# Patient Record
Sex: Female | Born: 1965 | Race: White | Hispanic: No | Marital: Married | Smoking: Never smoker
Health system: Southern US, Community
[De-identification: ages and names within clinical notes are randomized; demographics above are authoritative.]

---

## 2015-09-20 ENCOUNTER — Emergency Department (HOSPITAL_BASED_OUTPATIENT_CLINIC_OR_DEPARTMENT_OTHER)
Admission: EM | Admit: 2015-09-20 | Discharge: 2015-09-21 | Disposition: A | Discharge status: Home / Self Care | Payer: PRIVATE HEALTH INSURANCE | Attending: Emergency Medicine | Admitting: Emergency Medicine

## 2015-09-20 ENCOUNTER — Emergency Department (HOSPITAL_BASED_OUTPATIENT_CLINIC_OR_DEPARTMENT_OTHER): Payer: PRIVATE HEALTH INSURANCE

## 2015-09-20 ENCOUNTER — Encounter (HOSPITAL_BASED_OUTPATIENT_CLINIC_OR_DEPARTMENT_OTHER): Payer: Self-pay | Admitting: *Deleted

## 2015-09-20 DIAGNOSIS — Z3202 Encounter for pregnancy test, result negative: Secondary | ICD-10-CM | POA: Insufficient documentation

## 2015-09-20 DIAGNOSIS — Y9289 Other specified places as the place of occurrence of the external cause: Secondary | ICD-10-CM | POA: Insufficient documentation

## 2015-09-20 DIAGNOSIS — Y998 Other external cause status: Secondary | ICD-10-CM | POA: Insufficient documentation

## 2015-09-20 DIAGNOSIS — Y9389 Activity, other specified: Secondary | ICD-10-CM | POA: Insufficient documentation

## 2015-09-20 DIAGNOSIS — R200 Anesthesia of skin: Secondary | ICD-10-CM | POA: Insufficient documentation

## 2015-09-20 DIAGNOSIS — W19XXXA Unspecified fall, initial encounter: Secondary | ICD-10-CM

## 2015-09-20 DIAGNOSIS — W000XXA Fall on same level due to ice and snow, initial encounter: Secondary | ICD-10-CM | POA: Insufficient documentation

## 2015-09-20 DIAGNOSIS — S3992XA Unspecified injury of lower back, initial encounter: Secondary | ICD-10-CM | POA: Insufficient documentation

## 2015-09-20 DIAGNOSIS — M5442 Lumbago with sciatica, left side: Secondary | ICD-10-CM | POA: Insufficient documentation

## 2015-09-20 DIAGNOSIS — R52 Pain, unspecified: Secondary | ICD-10-CM

## 2015-09-20 LAB — PREGNANCY, URINE: PREG TEST UR: NEGATIVE

## 2015-09-20 MED ORDER — HYDROCODONE-ACETAMINOPHEN 5-325 MG PO TABS
1.0000 | ORAL_TABLET | Freq: Once | ORAL | Status: AC
  Administered 2015-09-20: 1 via ORAL
  Filled 2015-09-20: qty 1

## 2015-09-20 NOTE — ED Notes (Signed)
Bilateral lower back pain for over a week after falling on ice, no incontinence, no groin numbness, able to ambulate without significant difficulty.

## 2015-09-20 NOTE — ED Notes (Addendum)
She slipped in the snow a week ago. Injury to her lower back. Pain continues. Pain radiates down her left leg.

## 2015-09-20 NOTE — ED Provider Notes (Signed)
CSN: [id number]     Arrival date & time 09/20/15  1923 History   First MD Initiated Contact with Patient 09/20/15 2209     Chief Complaint  Patient presents with  . Back Pain     (Consider location/radiation/quality/duration/timing/severity/associated sxs/prior Treatment) The history is provided by the patient. A language interpreter was used (Interpretation provided by patient's family members).     Patient presents with low back pain that radiates into her left leg with tingling in the left leg to the level of the knee.  This began after slipping and falling on the ice last week.  Has been taking ibuprofen and aspirin with some improvement during the day.  Denies fever, abdominal pain, loss of control of bowel or bladder, leg weakness.    History reviewed. No pertinent past medical history. History reviewed. No pertinent past surgical history. No family history on file. Social History  Substance Use Topics  . Smoking status: Never Smoker   . Smokeless tobacco: None  . Alcohol Use: No   OB History    No data available     Review of Systems  Constitutional: Negative for fever.  Gastrointestinal: Negative for nausea, vomiting, abdominal pain, diarrhea and constipation.  Genitourinary: Negative for dysuria, urgency and frequency.  Musculoskeletal: Positive for back pain. Negative for gait problem and neck stiffness.  Skin: Negative for color change and wound.  Allergic/Immunologic: Negative for immunocompromised state.  Neurological: Positive for numbness. Negative for weakness.  Hematological: Does not bruise/bleed easily.  Psychiatric/Behavioral: Negative for self-injury (accidental ).      Allergies  Review of patient's allergies indicates no known allergies.  Home Medications   Prior to Admission medications   Not on File   BP 135/85 mmHg  Pulse 91  Temp(Src) 98.1 F (36.7 C) (Oral)  Resp 18  Ht 5\' 6"  (1.676 m)  Wt 91.003 kg  BMI 32.40 kg/m2  SpO2 100%   LMP 09/06/2015 Physical Exam  Constitutional: She appears well-developed and well-nourished. No distress.  HENT:  Head: Normocephalic and atraumatic.  Neck: Neck supple.  Pulmonary/Chest: Effort normal.  Abdominal: Soft. She exhibits no distension and no mass. There is no tenderness. There is no rebound and no guarding.  Musculoskeletal:       Back:  Spine no crepitus, or stepoffs.  Tenderness and left lumbar/sacral area only.   Lower extremities:  Strength 5/5, sensation intact, distal pulses intact.     Neurological: She is alert.  Skin: She is not diaphoretic.  Nursing note and vitals reviewed.   ED Course  Procedures (including critical care time) Labs Review Labs Reviewed - No data to display  Imaging Review No results found. I have personally reviewed and evaluated these images and lab results as part of my medical decision-making.   EKG Interpretation None      MDM   Final diagnoses:  Pain  Fall  Left-sided low back pain with left-sided sciatica    Afebrile, nontoxic patient with traumatic low back pain with left lower extremity radiculopathy. No red flags.  Xrays negative.  D/C home with norco, prednisone.  Pt going back to her home country [place] next week.   Discussed result, findings, treatment, and follow up  with patient.  Pt given return precautions.  Pt verbalizes understanding and agrees with plan.      [place] PetersburgEmily Danford Tat, PA-C 09/21/15 [phone]  Rolan BuccoMelanie Belfi, MD 09/21/15 1455

## 2015-09-21 MED ORDER — HYDROCODONE-ACETAMINOPHEN 5-325 MG PO TABS: 1.0000 | ORAL_TABLET | ORAL | Status: AC | PRN

## 2015-09-21 MED ORDER — PREDNISONE 10 MG (21) PO TBPK: 10.0000 mg | ORAL_TABLET | Freq: Every day | ORAL | Status: AC

## 2015-09-21 NOTE — Discharge Instructions (Signed)
Read the information below.  Use the prescribed medication as directed.  Please discuss all new medications with your pharmacist.  Do not take additional tylenol while taking the prescribed pain medication to avoid overdose.  You may return to the Emergency Department at any time for worsening condition or any new symptoms that concern you.    If you develop fevers, loss of control of bowel or bladder, weakness or numbness in your legs, or are unable to walk, return to the ER for a recheck.  °

## 2016-12-05 IMAGING — CR DG SACRUM/COCCYX 2+V
3 series · 3 of 3 positions shown · non-contrast
Comparison: None.

CLINICAL DATA: 49-year-old female with fall and back pain.

EXAM:
LUMBAR SPINE - COMPLETE 4+ VIEW; SACRUM AND COCCYX - 2+ VIEW

[t sacrum lat]
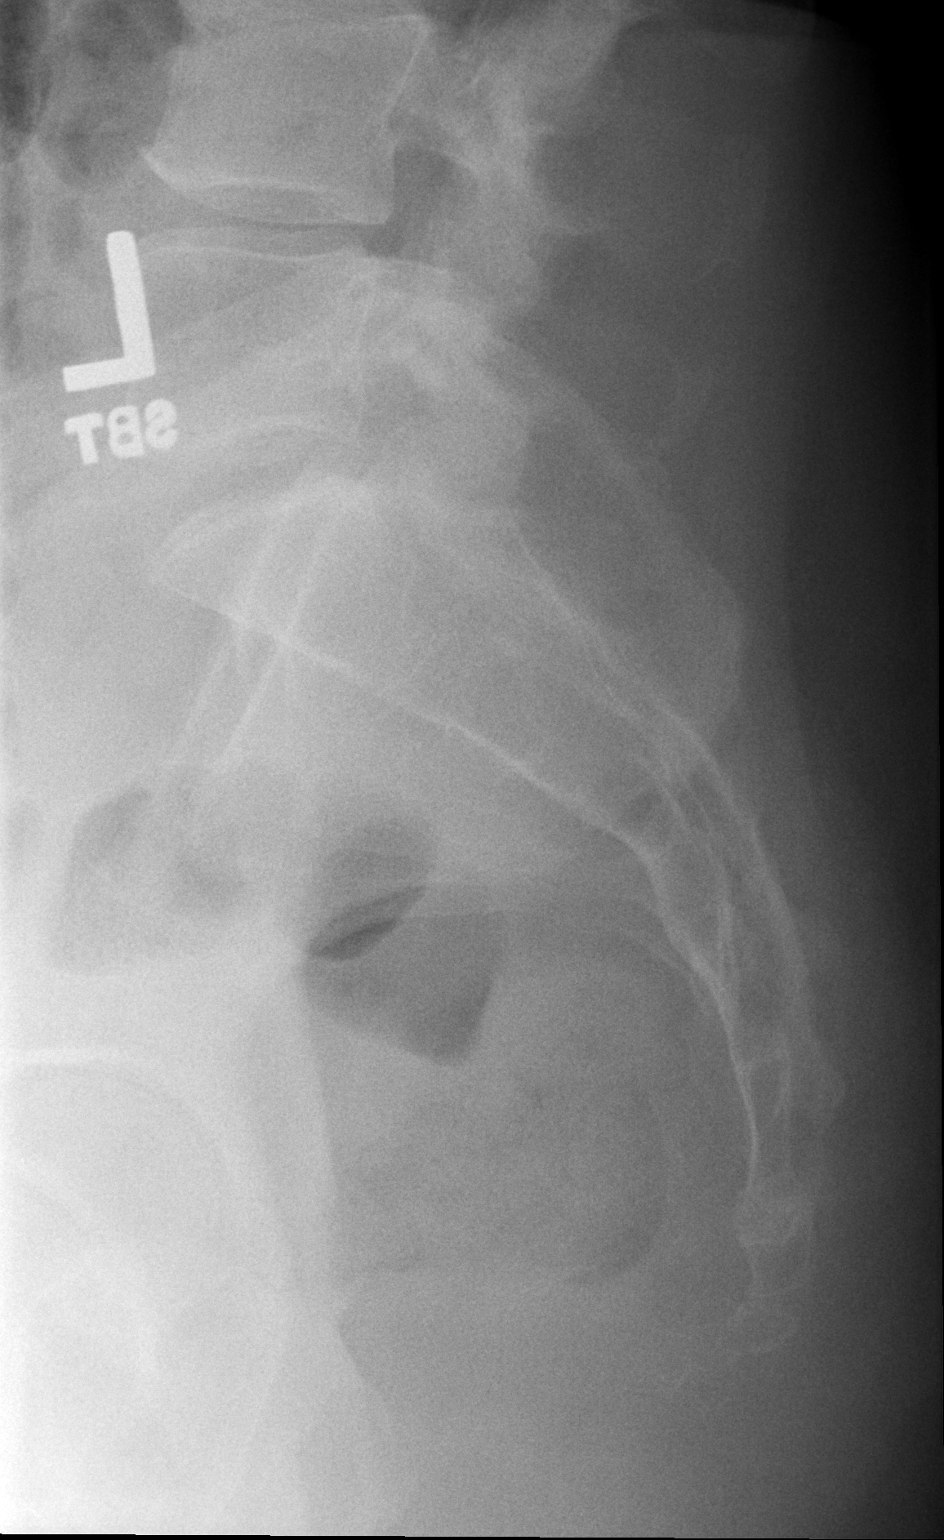

[t sacrum a.p.]
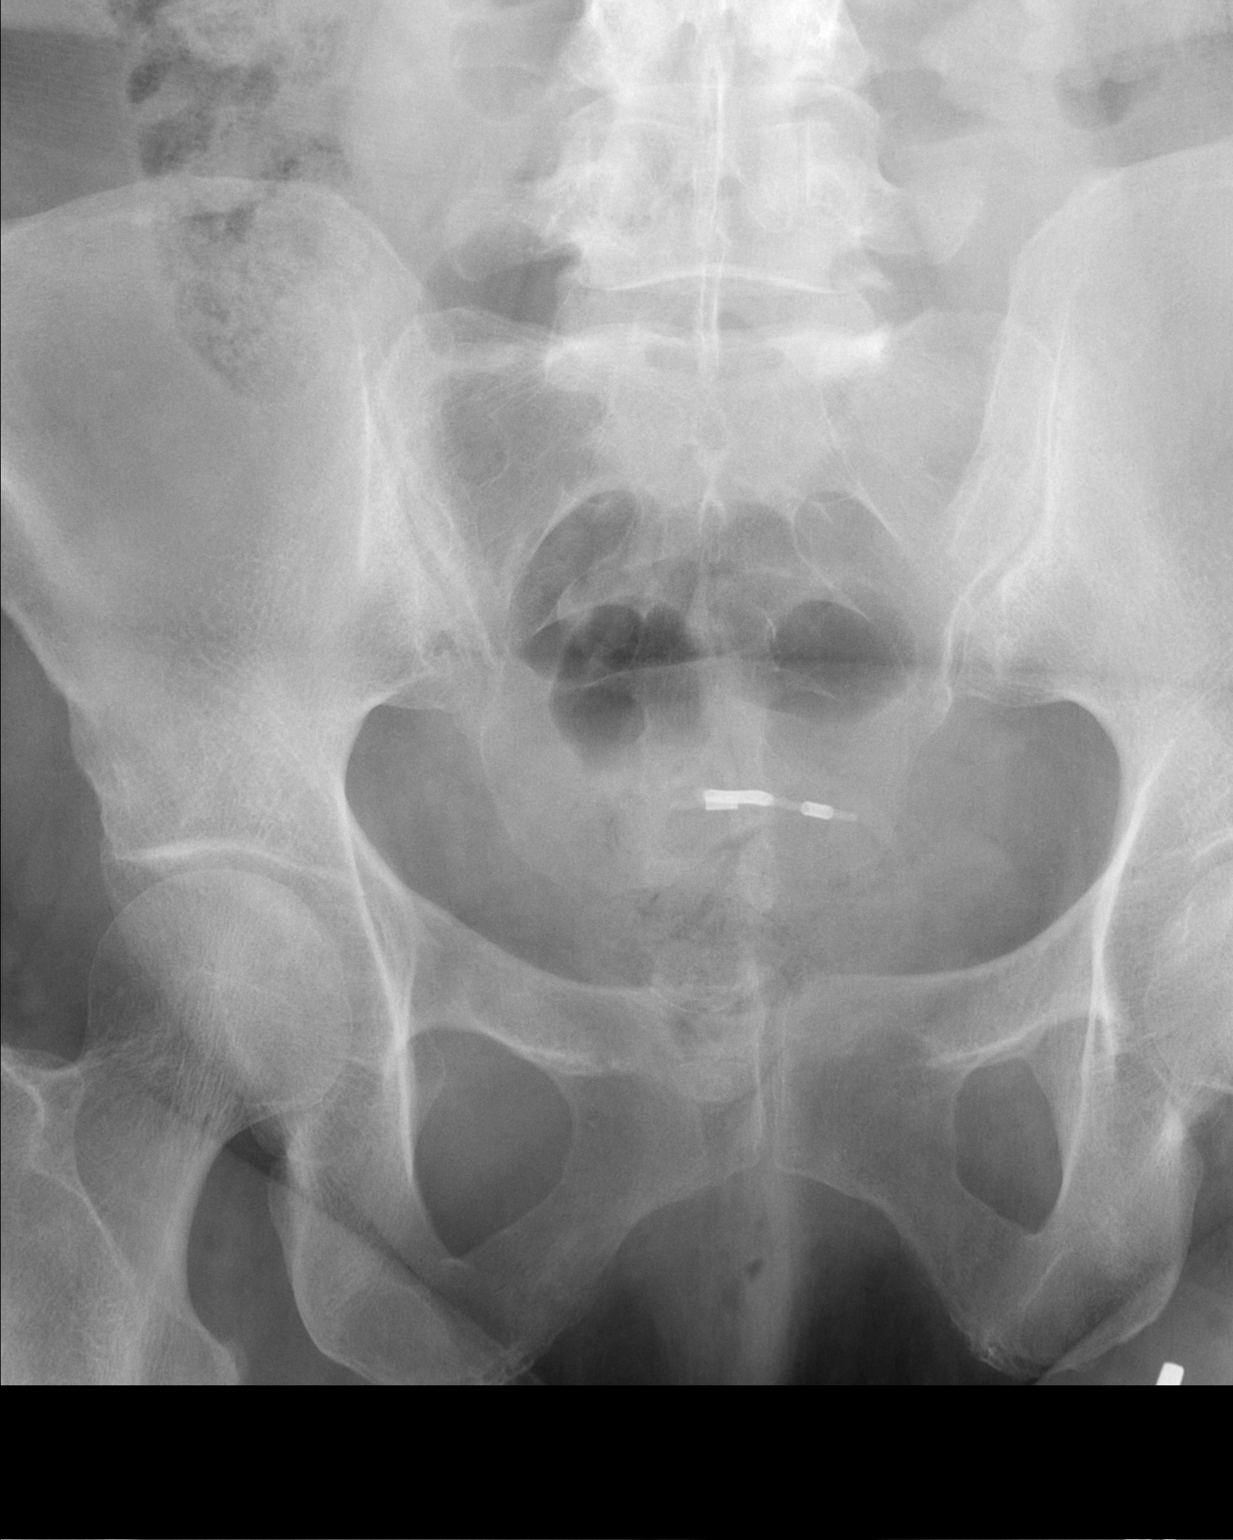

[t coccyx a.p.]
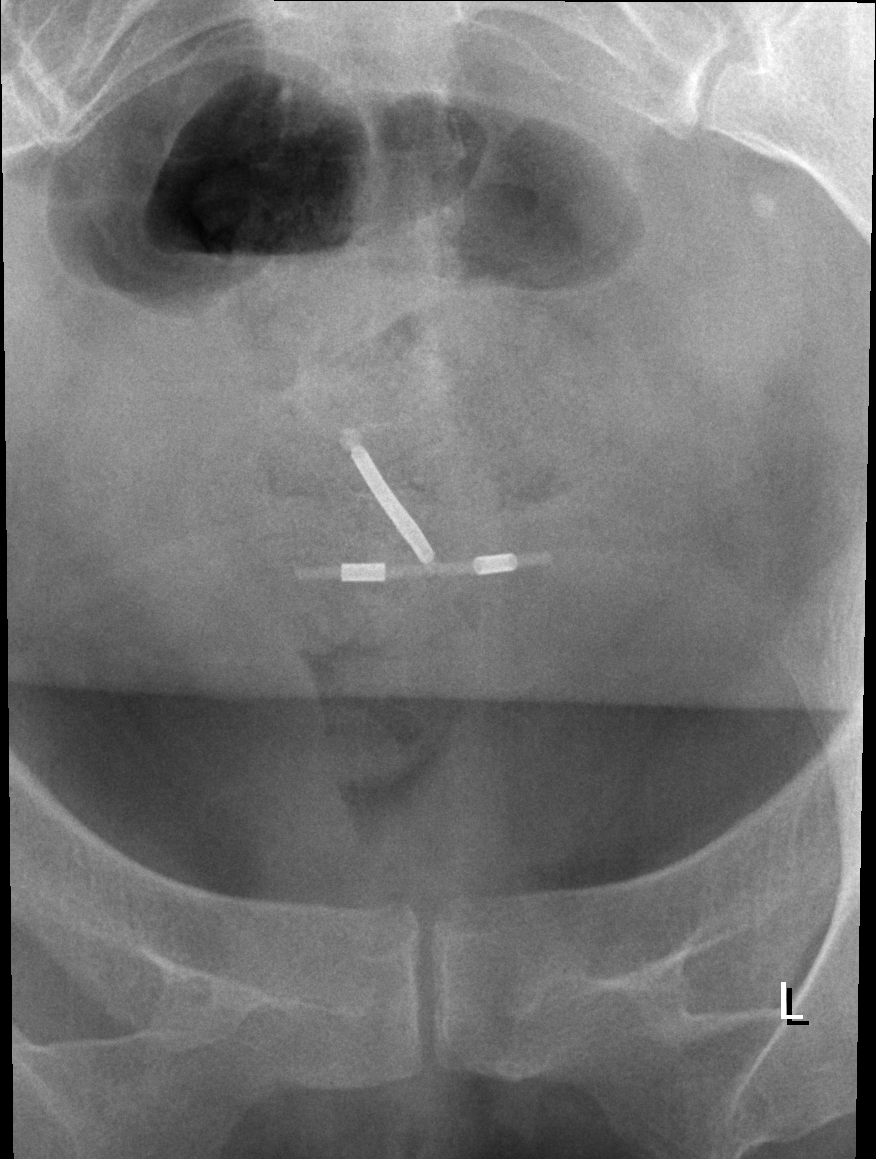

[3 of 3 positions shown; findings below may reference images not displayed]

FINDINGS: There is no acute fracture or subluxation of the lumbar spine. The
vertebral body heights and disc spaces are maintained. The
visualized transverse and spinous processes appear intact.

There is no fracture of the sacral bone. The visualized bony pelvis
appears unremarkable.

An intrauterine device is noted.
IMPRESSION: No acute/traumatic lumbar spine or sacral pathology.
